# Patient Record
Sex: Female | Born: 2000 | Race: White | Hispanic: No | Marital: Single | Smoking: Never smoker
Health system: Southern US, Community
[De-identification: ages and names within clinical notes are randomized; demographics above are authoritative.]

## PROBLEM LIST (undated history)

## (undated) DIAGNOSIS — K219 Gastro-esophageal reflux disease without esophagitis: Secondary | ICD-10-CM

## (undated) HISTORY — PX: CHOLECYSTECTOMY: SHX55

## (undated) HISTORY — PX: TONSILLECTOMY: SUR1361

---
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
## 2021-02-15 ENCOUNTER — Other Ambulatory Visit: Payer: Self-pay

## 2021-02-15 ENCOUNTER — Emergency Department (HOSPITAL_COMMUNITY)
Admission: EM | Admit: 2021-02-15 | Discharge: 2021-02-16 | Disposition: A | Discharge status: Home / Self Care | Payer: Medicaid Other | Attending: Emergency Medicine | Admitting: Emergency Medicine

## 2021-02-15 DIAGNOSIS — R509 Fever, unspecified: Secondary | ICD-10-CM | POA: Insufficient documentation

## 2021-02-15 DIAGNOSIS — Z5321 Procedure and treatment not carried out due to patient leaving prior to being seen by health care provider: Secondary | ICD-10-CM | POA: Insufficient documentation

## 2021-02-15 DIAGNOSIS — R109 Unspecified abdominal pain: Secondary | ICD-10-CM | POA: Diagnosis not present

## 2021-02-15 DIAGNOSIS — Z79899 Other long term (current) drug therapy: Secondary | ICD-10-CM | POA: Insufficient documentation

## 2021-02-15 DIAGNOSIS — R112 Nausea with vomiting, unspecified: Secondary | ICD-10-CM | POA: Insufficient documentation

## 2021-02-15 DIAGNOSIS — K219 Gastro-esophageal reflux disease without esophagitis: Secondary | ICD-10-CM | POA: Diagnosis not present

## 2021-02-15 LAB — COMPREHENSIVE METABOLIC PANEL
ALT: 41 U/L (ref 0–44)
AST: 32 U/L (ref 15–41)
Albumin: 4.2 g/dL (ref 3.5–5.0)
Alkaline Phosphatase: 66 U/L (ref 38–126)
Anion gap: 13 (ref 5–15)
BUN: 8 mg/dL (ref 6–20)
CO2: 21 mmol/L — ABNORMAL LOW (ref 22–32)
Calcium: 9.6 mg/dL (ref 8.9–10.3)
Chloride: 105 mmol/L (ref 98–111)
Creatinine, Ser: 0.66 mg/dL (ref 0.44–1.00)
GFR, Estimated: >60 mL/min (ref >60)
Glucose, Bld: 103 mg/dL — ABNORMAL HIGH (ref 70–99)
Potassium: 3.7 mmol/L (ref 3.5–5.1)
Sodium: 139 mmol/L (ref 135–145)
Total Bilirubin: 1 mg/dL (ref 0.3–1.2)
Total Protein: 8.3 g/dL — ABNORMAL HIGH (ref 6.5–8.1)

## 2021-02-15 LAB — CBC
HCT: 42.3 % (ref 36.0–46.0)
Hemoglobin: 14.1 g/dL (ref 12.0–15.0)
MCH: 28 pg (ref 26.0–34.0)
MCHC: 33.3 g/dL (ref 30.0–36.0)
MCV: 83.9 fL (ref 80.0–100.0)
Platelets: 346 10*3/uL (ref 150–400)
RBC: 5.04 MIL/uL (ref 3.87–5.11)
RDW: 12.8 % (ref 11.5–15.5)
WBC: 7.5 10*3/uL (ref 4.0–10.5)
nRBC: 0 % (ref 0.0–0.2)

## 2021-02-15 LAB — LIPASE, BLOOD: Lipase: 25 U/L (ref 11–51)

## 2021-02-15 LAB — I-STAT BETA HCG BLOOD, ED (MC, WL, AP ONLY): I-stat hCG, quantitative: <5 m[IU]/mL (ref <5)

## 2021-02-15 NOTE — ED Triage Notes (Signed)
Pt here POV with c/o of abdominal pain and emesis X2 days.  Pt feels she may have a stomach ulcer. Pain 1010. Denies fever today

## 2021-02-16 ENCOUNTER — Emergency Department (HOSPITAL_COMMUNITY): Payer: Medicaid Other

## 2021-02-16 ENCOUNTER — Emergency Department (HOSPITAL_BASED_OUTPATIENT_CLINIC_OR_DEPARTMENT_OTHER)
Admission: EM | Admit: 2021-02-16 | Discharge: 2021-02-16 | Disposition: A | Discharge status: Home / Self Care | Payer: Medicaid Other | Source: Home / Self Care | Attending: Emergency Medicine | Admitting: Emergency Medicine

## 2021-02-16 ENCOUNTER — Other Ambulatory Visit: Payer: Self-pay

## 2021-02-16 ENCOUNTER — Encounter (HOSPITAL_BASED_OUTPATIENT_CLINIC_OR_DEPARTMENT_OTHER): Payer: Self-pay | Admitting: *Deleted

## 2021-02-16 DIAGNOSIS — K219 Gastro-esophageal reflux disease without esophagitis: Secondary | ICD-10-CM | POA: Insufficient documentation

## 2021-02-16 DIAGNOSIS — Z79899 Other long term (current) drug therapy: Secondary | ICD-10-CM | POA: Insufficient documentation

## 2021-02-16 DIAGNOSIS — R1010 Upper abdominal pain, unspecified: Secondary | ICD-10-CM | POA: Insufficient documentation

## 2021-02-16 DIAGNOSIS — R109 Unspecified abdominal pain: Secondary | ICD-10-CM

## 2021-02-16 DIAGNOSIS — E860 Dehydration: Secondary | ICD-10-CM

## 2021-02-16 DIAGNOSIS — R509 Fever, unspecified: Secondary | ICD-10-CM | POA: Insufficient documentation

## 2021-02-16 DIAGNOSIS — R112 Nausea with vomiting, unspecified: Secondary | ICD-10-CM | POA: Insufficient documentation

## 2021-02-16 HISTORY — DX: Gastro-esophageal reflux disease without esophagitis: K21.9

## 2021-02-16 LAB — RAPID URINE DRUG SCREEN, HOSP PERFORMED
Amphetamines: NOT DETECTED
Barbiturates: POSITIVE — AB
Benzodiazepines: NOT DETECTED
Cocaine: NOT DETECTED
Opiates: NOT DETECTED
Tetrahydrocannabinol: POSITIVE — AB

## 2021-02-16 LAB — URINALYSIS, ROUTINE W REFLEX MICROSCOPIC
Bacteria, UA: NONE SEEN
Bilirubin Urine: NEGATIVE
Glucose, UA: NEGATIVE mg/dL
Glucose, UA: NEGATIVE mg/dL
Hgb urine dipstick: NEGATIVE
Hgb urine dipstick: NEGATIVE
Ketones, ur: 80 mg/dL — AB
Ketones, ur: >80 mg/dL — AB
Leukocytes,Ua: NEGATIVE
Leukocytes,Ua: NEGATIVE
Nitrite: NEGATIVE
Nitrite: NEGATIVE
Protein, ur: 30 mg/dL — AB
Protein, ur: 100 mg/dL — AB
Specific Gravity, Urine: >1.046 — ABNORMAL HIGH (ref 1.005–1.030)
Specific Gravity, Urine: 1.046 — ABNORMAL HIGH (ref 1.005–1.030)
pH: 6 (ref 5.0–8.0)
pH: 6 (ref 5.0–8.0)

## 2021-02-16 LAB — CBC
HCT: 18.6 % — ABNORMAL LOW (ref 36.0–46.0)
HCT: 40 % (ref 36.0–46.0)
Hemoglobin: 6.3 g/dL — CL (ref 12.0–15.0)
Hemoglobin: 13.9 g/dL (ref 12.0–15.0)
MCH: 28.1 pg (ref 26.0–34.0)
MCH: 28.2 pg (ref 26.0–34.0)
MCHC: 33.9 g/dL (ref 30.0–36.0)
MCHC: 34.8 g/dL (ref 30.0–36.0)
MCV: 83 fL (ref 80.0–100.0)
MCV: 81.1 fL (ref 80.0–100.0)
Platelets: 173 10*3/uL (ref 150–400)
Platelets: 352 10*3/uL (ref 150–400)
RBC: 2.24 MIL/uL — ABNORMAL LOW (ref 3.87–5.11)
RBC: 4.93 MIL/uL (ref 3.87–5.11)
RDW: 12.9 % (ref 11.5–15.5)
RDW: 12.9 % (ref 11.5–15.5)
WBC: 4.3 10*3/uL (ref 4.0–10.5)
WBC: 9 10*3/uL (ref 4.0–10.5)
nRBC: 0 % (ref 0.0–0.2)
nRBC: 0 % (ref 0.0–0.2)

## 2021-02-16 LAB — PREGNANCY, URINE: Preg Test, Ur: NEGATIVE

## 2021-02-16 LAB — COMPREHENSIVE METABOLIC PANEL
ALT: 37 U/L (ref 0–44)
AST: 31 U/L (ref 15–41)
Albumin: 4.1 g/dL (ref 3.5–5.0)
Alkaline Phosphatase: 62 U/L (ref 38–126)
Anion gap: 15 (ref 5–15)
BUN: 10 mg/dL (ref 6–20)
CO2: 19 mmol/L — ABNORMAL LOW (ref 22–32)
Calcium: 9 mg/dL (ref 8.9–10.3)
Chloride: 104 mmol/L (ref 98–111)
Creatinine, Ser: 0.54 mg/dL (ref 0.44–1.00)
GFR, Estimated: >60 mL/min (ref >60)
Glucose, Bld: 88 mg/dL (ref 70–99)
Potassium: 3.5 mmol/L (ref 3.5–5.1)
Sodium: 138 mmol/L (ref 135–145)
Total Bilirubin: 1.3 mg/dL — ABNORMAL HIGH (ref 0.3–1.2)
Total Protein: 7.8 g/dL (ref 6.5–8.1)

## 2021-02-16 LAB — LIPASE, BLOOD: Lipase: 12 U/L (ref 11–51)

## 2021-02-16 MED ORDER — FAMOTIDINE IN NACL 20-0.9 MG/50ML-% IV SOLN
20.0000 mg | Freq: Once | INTRAVENOUS | Status: AC
  Administered 2021-02-16: 20 mg via INTRAVENOUS
  Filled 2021-02-16: qty 50

## 2021-02-16 MED ORDER — ONDANSETRON HCL 4 MG/2ML IJ SOLN
INTRAMUSCULAR | Status: AC
  Filled 2021-02-16: qty 2

## 2021-02-16 MED ORDER — SODIUM CHLORIDE 0.9 % IV BOLUS
1000.0000 mL | Freq: Once | INTRAVENOUS | Status: AC
  Administered 2021-02-16: 1000 mL via INTRAVENOUS

## 2021-02-16 MED ORDER — ONDANSETRON 4 MG PO TBDP
4.0000 mg | ORAL_TABLET | Freq: Once | ORAL | Status: AC
  Administered 2021-02-16: 4 mg via ORAL
  Filled 2021-02-16: qty 1

## 2021-02-16 MED ORDER — LACTATED RINGERS IV BOLUS
1000.0000 mL | Freq: Once | INTRAVENOUS | Status: AC
  Administered 2021-02-16: 1000 mL via INTRAVENOUS

## 2021-02-16 MED ORDER — MORPHINE SULFATE (PF) 4 MG/ML IV SOLN
4.0000 mg | Freq: Once | INTRAVENOUS | Status: AC
  Administered 2021-02-16: 4 mg via INTRAVENOUS
  Filled 2021-02-16: qty 1

## 2021-02-16 MED ORDER — ONDANSETRON 8 MG PO TBDP: 8.0000 mg | ORAL_TABLET | Freq: Three times a day (TID) | ORAL | 0 refills | Status: AC | PRN

## 2021-02-16 MED ORDER — ONDANSETRON HCL 4 MG/2ML IJ SOLN
4.0000 mg | Freq: Once | INTRAMUSCULAR | Status: AC
  Administered 2021-02-16: 4 mg via INTRAVENOUS
  Filled 2021-02-16: qty 2

## 2021-02-16 MED ORDER — ACETAMINOPHEN 325 MG PO TABS
650.0000 mg | ORAL_TABLET | Freq: Once | ORAL | Status: AC
  Administered 2021-02-16: 650 mg via ORAL
  Filled 2021-02-16: qty 2

## 2021-02-16 MED ORDER — ONDANSETRON HCL 4 MG/2ML IJ SOLN
4.0000 mg | Freq: Once | INTRAMUSCULAR | Status: AC
  Administered 2021-02-16: 4 mg via INTRAVENOUS

## 2021-02-16 MED ORDER — IOHEXOL 300 MG/ML  SOLN
100.0000 mL | Freq: Once | INTRAMUSCULAR | Status: AC | PRN
  Administered 2021-02-16: 100 mL via INTRAVENOUS

## 2021-02-16 NOTE — ED Triage Notes (Signed)
Pt arrives via [facility] for RUQ abdominal pain with N/V. Pt was seen at [facility] last night for same, left AMA.   EMS last VS - 146/82, HR 82, CBG 108, 98% on RA   # 18 L AC

## 2021-02-16 NOTE — ED Triage Notes (Signed)
Abdominal pain, nausea and vomiting and diarrhea.  Low grade temp of 99.5 3 days ago.

## 2021-02-16 NOTE — ED Notes (Signed)
Patient states her pain is coming back. Triage rn notified

## 2021-02-16 NOTE — ED Notes (Signed)
The patient stated that she would like her IV removed so that she could leave. The patient left without being seen. The patient was informed that she should return or seek medical help if her symptoms worsen.
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
## 2021-02-16 NOTE — Discharge Instructions (Addendum)
It was our pleasure to provide your ER care today - we hope that you feel better.  Drink plenty of fluids/stay well hydrated.   Follow up with primary care doctor in the next few days if symptoms fail to improve/resolve.  If gi symptoms, try pepcid and maalox for symptom relief. Take zofran as need, as prescribed,  for nausea.   Return to ER if worse, new symptoms, fevers, new, worsening or severe pain, persistent vomiting, or other concern.   Please note that increasingly we are seeing THC/marijuana as the cause of a recurrent abdominal pain and nausea/vomiting condition called Cannabinoid Hyperemesis Syndrome - in these cases, avoiding marijuana use will prevent symptoms from recurring - see attached information.   You were given pain medication in the ER - no driving for the next 8 hours.

## 2021-02-20 ENCOUNTER — Emergency Department (HOSPITAL_BASED_OUTPATIENT_CLINIC_OR_DEPARTMENT_OTHER)
Admission: EM | Admit: 2021-02-20 | Discharge: 2021-02-20 | Disposition: A | Discharge status: Home / Self Care | Payer: Medicaid Other | Attending: Emergency Medicine | Admitting: Emergency Medicine

## 2021-02-20 ENCOUNTER — Encounter (HOSPITAL_BASED_OUTPATIENT_CLINIC_OR_DEPARTMENT_OTHER): Payer: Self-pay

## 2021-02-20 DIAGNOSIS — K219 Gastro-esophageal reflux disease without esophagitis: Secondary | ICD-10-CM | POA: Diagnosis not present

## 2021-02-20 DIAGNOSIS — R1013 Epigastric pain: Secondary | ICD-10-CM | POA: Diagnosis present

## 2021-02-20 DIAGNOSIS — R112 Nausea with vomiting, unspecified: Secondary | ICD-10-CM | POA: Diagnosis not present

## 2021-02-20 LAB — PREGNANCY, URINE: Preg Test, Ur: NEGATIVE

## 2021-02-20 LAB — URINALYSIS, ROUTINE W REFLEX MICROSCOPIC
Glucose, UA: NEGATIVE mg/dL
Ketones, ur: >80 mg/dL — AB
Leukocytes,Ua: NEGATIVE
Nitrite: NEGATIVE
Protein, ur: 30 mg/dL — AB
Specific Gravity, Urine: 1.037 — ABNORMAL HIGH (ref 1.005–1.030)
pH: 6 (ref 5.0–8.0)

## 2021-02-20 MED ORDER — ONDANSETRON HCL 4 MG/2ML IJ SOLN
4.0000 mg | Freq: Once | INTRAMUSCULAR | Status: AC
  Administered 2021-02-20: 4 mg via INTRAVENOUS
  Filled 2021-02-20: qty 2

## 2021-02-20 MED ORDER — LIDOCAINE VISCOUS HCL 2 % MT SOLN
15.0000 mL | Freq: Once | OROMUCOSAL | Status: AC
  Administered 2021-02-20: 15 mL via ORAL
  Filled 2021-02-20: qty 15

## 2021-02-20 MED ORDER — SODIUM CHLORIDE 0.9 % IV BOLUS
1000.0000 mL | Freq: Once | INTRAVENOUS | Status: AC
  Administered 2021-02-20: 1000 mL via INTRAVENOUS

## 2021-02-20 MED ORDER — ALUM & MAG HYDROXIDE-SIMETH 200-200-20 MG/5ML PO SUSP
30.0000 mL | Freq: Once | ORAL | Status: AC
  Administered 2021-02-20: 30 mL via ORAL
  Filled 2021-02-20: qty 30

## 2021-02-20 MED ORDER — DICYCLOMINE HCL 10 MG PO CAPS
10.0000 mg | ORAL_CAPSULE | Freq: Once | ORAL | Status: AC
  Administered 2021-02-20: 10 mg via ORAL
  Filled 2021-02-20: qty 1

## 2021-02-20 MED ORDER — OMEPRAZOLE 20 MG PO CPDR: 20.0000 mg | DELAYED_RELEASE_CAPSULE | Freq: Two times a day (BID) | ORAL | 0 refills | Status: AC

## 2021-02-20 MED ORDER — SUCRALFATE 1 G PO TABS: 1.0000 g | ORAL_TABLET | Freq: Three times a day (TID) | ORAL | 0 refills | Status: AC

## 2021-02-20 NOTE — ED Notes (Signed)
Pt verbalizes understanding of discharge instructions. Opportunity for questioning and answers were provided. Armand removed by staff, pt discharged from ED to home. Educated to pick up Rx and f/u with GI.

## 2021-02-20 NOTE — ED Triage Notes (Addendum)
Pt was BIBA for ongoing epigastric pain and N/V for the last three to four days, although sx have been off and on since 4th of July weekend. Last episode of emesis was appx last night at 2200. Last use of marijuana was three days ago. Took zofran with minimal relief of nausea.
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
## 2021-02-20 NOTE — Discharge Instructions (Addendum)
You were seen today for abdominal pain.  I suspect you may have reflux or an ulcer.  Start omeprazole daily.  Use Carafate as needed.  Continue Zofran.  It is very importantly follow-up with gastroenterology.

## 2021-02-21 ENCOUNTER — Ambulatory Visit: Payer: Self-pay

## 2021-02-21 NOTE — Telephone Encounter (Signed)
Pt. Reports she was seen in ED yesterday and saw PCP today for abdominal pain and vomiting. States Zofran "helps some but I'm still not keeping down solid food and having a lot of abdominal pain." Pain 10/10. Instructed to go to UC/ED. Verbalizes understanding.    Reason for Disposition  Patient sounds very sick or weak to the triager  Answer Assessment - Initial Assessment Questions 1. VOMITING SEVERITY: "How many times have you vomited in the past 24 hours?"     - MILD:  1 - 2 times/day    - MODERATE: 3 - 5 times/day, decreased oral intake without significant weight loss or symptoms of dehydration    - SEVERE: 6 or more times/day, vomits everything or nearly everything, with significant weight loss, symptoms of dehydration      4 2. ONSET: "When did the vomiting begin?"      July 2 3. FLUIDS: "What fluids or food have you vomited up today?" "Have you been able to keep any fluids down?"     Yes 4. ABDOMINAL PAIN: "Are your having any abdominal pain?" If yes : "How bad is it and what does it feel like?" (e.g., crampy, dull, intermittent, constant)      Yes   10/10 5. DIARRHEA: "Is there any diarrhea?" If Yes, ask: "How many times today?"      No 6. CONTACTS: "Is there anyone else in the family with the same symptoms?"      No 7. CAUSE: "What do you think is causing your vomiting?"     Unsure 8. HYDRATION STATUS: "Any signs of dehydration?" (e.g., dry mouth [not only dry lips], too weak to stand) "When did you last urinate?"     No 9. OTHER SYMPTOMS: "Do you have any other symptoms?" (e.g., fever, headache, vertigo, vomiting blood or coffee grounds, recent head injury)     No 10. PREGNANCY: "Is there any chance you are pregnant?" "When was your last menstrual period?"       No  Protocols used: Vomiting-A-AH

## 2021-02-22 ENCOUNTER — Emergency Department (HOSPITAL_BASED_OUTPATIENT_CLINIC_OR_DEPARTMENT_OTHER)
Admission: EM | Admit: 2021-02-22 | Discharge: 2021-02-22 | Disposition: A | Discharge status: Home / Self Care | Payer: Medicaid Other | Attending: Emergency Medicine | Admitting: Emergency Medicine

## 2021-02-22 ENCOUNTER — Other Ambulatory Visit: Payer: Self-pay

## 2021-02-22 ENCOUNTER — Encounter (HOSPITAL_BASED_OUTPATIENT_CLINIC_OR_DEPARTMENT_OTHER): Payer: Self-pay

## 2021-02-22 DIAGNOSIS — R1084 Generalized abdominal pain: Secondary | ICD-10-CM | POA: Diagnosis not present

## 2021-02-22 DIAGNOSIS — R112 Nausea with vomiting, unspecified: Secondary | ICD-10-CM | POA: Insufficient documentation

## 2021-02-22 DIAGNOSIS — R824 Acetonuria: Secondary | ICD-10-CM | POA: Diagnosis not present

## 2021-02-22 LAB — COMPREHENSIVE METABOLIC PANEL
ALT: 40 U/L (ref 0–44)
AST: 26 U/L (ref 15–41)
Albumin: 4.3 g/dL (ref 3.5–5.0)
Alkaline Phosphatase: 63 U/L (ref 38–126)
Anion gap: 18 — ABNORMAL HIGH (ref 5–15)
BUN: 9 mg/dL (ref 6–20)
CO2: 16 mmol/L — ABNORMAL LOW (ref 22–32)
Calcium: 9.1 mg/dL (ref 8.9–10.3)
Chloride: 103 mmol/L (ref 98–111)
Creatinine, Ser: 0.57 mg/dL (ref 0.44–1.00)
GFR, Estimated: >60 mL/min (ref >60)
Glucose, Bld: 74 mg/dL (ref 70–99)
Potassium: 3.3 mmol/L — ABNORMAL LOW (ref 3.5–5.1)
Sodium: 137 mmol/L (ref 135–145)
Total Bilirubin: 1 mg/dL (ref 0.3–1.2)
Total Protein: 8.1 g/dL (ref 6.5–8.1)

## 2021-02-22 LAB — LIPASE, BLOOD: Lipase: 42 U/L (ref 11–51)

## 2021-02-22 LAB — URINALYSIS, ROUTINE W REFLEX MICROSCOPIC
Bilirubin Urine: NEGATIVE
Glucose, UA: NEGATIVE mg/dL
Hgb urine dipstick: NEGATIVE
Ketones, ur: >80 mg/dL — AB
Leukocytes,Ua: NEGATIVE
Nitrite: NEGATIVE
Protein, ur: 30 mg/dL — AB
Specific Gravity, Urine: 1.027 (ref 1.005–1.030)
pH: 5.5 (ref 5.0–8.0)

## 2021-02-22 LAB — CBC
HCT: 38.4 % (ref 36.0–46.0)
Hemoglobin: 13.3 g/dL (ref 12.0–15.0)
MCH: 28.2 pg (ref 26.0–34.0)
MCHC: 34.6 g/dL (ref 30.0–36.0)
MCV: 81.4 fL (ref 80.0–100.0)
Platelets: 329 10*3/uL (ref 150–400)
RBC: 4.72 MIL/uL (ref 3.87–5.11)
RDW: 12.7 % (ref 11.5–15.5)
WBC: 11 10*3/uL — ABNORMAL HIGH (ref 4.0–10.5)
nRBC: 0 % (ref 0.0–0.2)

## 2021-02-22 LAB — PREGNANCY, URINE: Preg Test, Ur: NEGATIVE

## 2021-02-22 MED ORDER — LACTATED RINGERS IV BOLUS
1000.0000 mL | Freq: Once | INTRAVENOUS | Status: AC
  Administered 2021-02-22: 1000 mL via INTRAVENOUS

## 2021-02-22 MED ORDER — HALOPERIDOL LACTATE 5 MG/ML IJ SOLN
5.0000 mg | Freq: Once | INTRAMUSCULAR | Status: AC
  Administered 2021-02-22: 5 mg via INTRAVENOUS
  Filled 2021-02-22: qty 1

## 2021-02-22 NOTE — ED Provider Notes (Signed)
[facility] EMERGENCY DEPARTMENT Provider Note  CSN: [id number] Arrival date & time: 02/22/21 0803    History Chief Complaint  Patient presents with   Abdominal Pain    Catherine Conley is a 20 y.o. female presents for re-evaluation of persistent abdominal pain and vomiting ongoing for about a month. She has been seen in the ED twice in the last week for same. Had negative workup each time and was improved prior to discharge. She saw PCP yesterday and was referred to GI. She has been taking PPI, antiemetics, carafate without improvement. She was cautioned at one previous visit that her marijuana use may be contributing and so she stopped smoking 5-6 days ago.    Past Medical History:  Diagnosis Date   Abdominal pain    Constipation    GERD (gastroesophageal reflux disease)     Past Surgical History:  Procedure Laterality Date   TONSILLECTOMY      Family History  Problem Relation Age of Onset   Ulcers Father    Psychiatric Illness Father        Bipolar   Bipolar disorder Father    COPD Father    Rheum arthritis Father    Depression Brother    Anxiety disorder Brother    Hypertension Mother    Psychiatric Illness Mother        Bipolar   Bipolar disorder Mother    Alcohol abuse Mother    Cholelithiasis Paternal Grandmother    Cancer Paternal Grandmother        breast CA    Social History   Tobacco Use   Smoking status: Never   Smokeless tobacco: Never  Vaping Use   Vaping Use: Never used  Substance Use Topics   Alcohol use: No   Drug use: Yes    Types: Marijuana    Comment: 3 days ago     Home Medications Prior to Admission medications   Medication Sig Start Date End Date Taking? Authorizing Provider  omeprazole (PRILOSEC) 20 MG capsule Take 1 capsule (20 mg total) by mouth 2 (two) times daily before a meal. 02/20/21  Yes Horton, Mayer Masker, MD  ondansetron (ZOFRAN ODT) 8 MG disintegrating tablet Take 1 tablet (8 mg total) by mouth every 8  (eight) hours as needed for nausea or vomiting. 02/16/21  Yes Cathren Laine, MD  sucralfate (CARAFATE) 1 g tablet Take 1 tablet (1 g total) by mouth 4 (four) times daily -  with meals and at bedtime. 02/20/21  Yes Horton, Mayer Masker, MD  etonogestrel (NEXPLANON) 68 MG IMPL implant Inject into the skin.    [provider]     Allergies    Patient has no known allergies.   Review of Systems   Review of Systems A comprehensive review of systems was completed and negative except as noted in HPI.    Physical Exam BP (!) 124/102   Pulse 83   Temp 98.2 F (36.8 C) (Oral)   Resp 18   Ht 5\' 7"  (1.702 m)   Wt 93 kg   SpO2 100%   BMI 32.11 kg/m   Physical Exam Vitals and nursing note reviewed.  Constitutional:      Appearance: Normal appearance.  HENT:     Head: Normocephalic and atraumatic.     Nose: Nose normal.     Mouth/Throat:     Mouth: Mucous membranes are moist.  Eyes:     Extraocular Movements: Extraocular movements intact.     Conjunctiva/sclera: Conjunctivae  normal.  Cardiovascular:     Rate and Rhythm: Normal rate.  Pulmonary:     Effort: Pulmonary effort is normal.     Breath sounds: Normal breath sounds.  Abdominal:     General: Abdomen is flat.     Palpations: Abdomen is soft.     Tenderness: There is generalized abdominal tenderness. There is no guarding. Negative signs include Murphy's sign and McBurney's sign.  Musculoskeletal:        General: No swelling. Normal range of motion.     Cervical back: Neck supple.  Skin:    General: Skin is warm and dry.  Neurological:     General: No focal deficit present.     Mental Status: She is alert.  Psychiatric:        Mood and Affect: Mood normal.     ED Results / Procedures / Treatments   Labs (all labs ordered are listed, but only abnormal results are displayed) Labs Reviewed  COMPREHENSIVE METABOLIC PANEL - Abnormal; Notable for the following components:      Result Value   Potassium 3.3 (*)     CO2 16 (*)    Anion gap 18 (*)    All other components within normal limits  CBC - Abnormal; Notable for the following components:   WBC 11.0 (*)    All other components within normal limits  URINALYSIS, ROUTINE W REFLEX MICROSCOPIC - Abnormal; Notable for the following components:   Ketones, ur >80 (*)    Protein, ur 30 (*)    Bacteria, UA RARE (*)    All other components within normal limits  LIPASE, BLOOD  PREGNANCY, URINE    EKG None   Radiology No results found.  Procedures Procedures  Medications Ordered in the ED Medications  haloperidol lactate (HALDOL) injection 5 mg (5 mg Intravenous Given 02/22/21 0922)  lactated ringers bolus 1,000 mL (0 mLs Intravenous Stopped 02/22/21 1027)  lactated ringers bolus 1,000 mL (0 mLs Intravenous Stopped 02/22/21 1306)     MDM Rules/Calculators/A&P MDM Patient with continued abdominal pain and reported vomiting at home. None seen in the ED. Will give IVF, recheck labs and Haldol for antiemetic.   ED Course  I have reviewed the triage vital signs and the nursing notes.  Pertinent labs & imaging results that were available during my care of the patient were reviewed by me and considered in my medical decision making (see chart for details).  Clinical Course as of 02/22/21 1452  Wed Feb 22, 2021  0930 CBC unremarkable. CMP with mild hypokalemia and mild anion gap, likely from starvation ketosis based on ketones in UA from previous visit. Awaiting UA today.  [CS]  1120 UA again shows marked ketonuria. Patient states she is feeling better but still reluctant to try to eat. Will give a second bolus of LR to help clear ketones and reassess.  [CS]  1334 Patient feeling 100% better. Has tolerated gingerale and a snack. Advised to keep drinking plenty of fluids, preferably with electrolytes and advance her diet as tolerated. She has had a GI referral from her PCP ([facility]) but not scheduled yet. Will also give referral to [facility] if she  has trouble getting an appointment.  [CS]    Clinical Course User Index [CS] Pollyann Savoy, MD    Final Clinical Impression(s) / ED Diagnoses Final diagnoses:  Non-intractable vomiting with nausea, unspecified vomiting type  Ketonuria    Rx / DC Orders ED Discharge Orders  None        Pollyann Savoy, MD 02/22/21 1452

## 2021-02-22 NOTE — ED Notes (Signed)
Pt arrived by EMS from home.  Pt reports recently diagnosed with peptic ulcer.  Has a lot of pain after eating and has been vomiting.  Last vomiting episode was just before EMS picking her up.  Vitals 144/98, HR 84, Resp 16, 99% RA, CBG 71. 

## 2021-02-22 NOTE — ED Triage Notes (Signed)
Pt arrived by EMS from home.  Pt reports recently diagnosed with peptic ulcer.  Has a lot of pain after eating and has been vomiting.  Last vomiting episode was just before EMS picking her up.  Vitals 144/98, HR 84, Resp 16, 99% RA, CBG 71.

## 2021-02-22 NOTE — ED Notes (Signed)
Patient is resting comfortably. 

## 2021-02-23 ENCOUNTER — Emergency Department (HOSPITAL_BASED_OUTPATIENT_CLINIC_OR_DEPARTMENT_OTHER): Payer: Medicaid Other

## 2021-02-23 ENCOUNTER — Other Ambulatory Visit: Payer: Self-pay

## 2021-02-23 ENCOUNTER — Encounter (HOSPITAL_BASED_OUTPATIENT_CLINIC_OR_DEPARTMENT_OTHER): Payer: Self-pay | Admitting: Emergency Medicine

## 2021-02-23 ENCOUNTER — Emergency Department (HOSPITAL_BASED_OUTPATIENT_CLINIC_OR_DEPARTMENT_OTHER)
Admission: EM | Admit: 2021-02-23 | Discharge: 2021-02-24 | Disposition: A | Discharge status: Home / Self Care | Payer: Medicaid Other | Attending: Emergency Medicine | Admitting: Emergency Medicine

## 2021-02-23 DIAGNOSIS — R1110 Vomiting, unspecified: Secondary | ICD-10-CM | POA: Diagnosis present

## 2021-02-23 DIAGNOSIS — G2402 Drug induced acute dystonia: Secondary | ICD-10-CM

## 2021-02-23 DIAGNOSIS — U071 COVID-19: Secondary | ICD-10-CM

## 2021-02-23 LAB — MAGNESIUM: Magnesium: 1.8 mg/dL (ref 1.7–2.4)

## 2021-02-23 LAB — COMPREHENSIVE METABOLIC PANEL
ALT: 50 U/L — ABNORMAL HIGH (ref 0–44)
AST: 33 U/L (ref 15–41)
Albumin: 4.6 g/dL (ref 3.5–5.0)
Alkaline Phosphatase: 69 U/L (ref 38–126)
Anion gap: 20 — ABNORMAL HIGH (ref 5–15)
BUN: 5 mg/dL — ABNORMAL LOW (ref 6–20)
CO2: 15 mmol/L — ABNORMAL LOW (ref 22–32)
Calcium: 9.6 mg/dL (ref 8.9–10.3)
Chloride: 101 mmol/L (ref 98–111)
Creatinine, Ser: 0.57 mg/dL (ref 0.44–1.00)
GFR, Estimated: >60 mL/min (ref >60)
Glucose, Bld: 114 mg/dL — ABNORMAL HIGH (ref 70–99)
Potassium: 3.3 mmol/L — ABNORMAL LOW (ref 3.5–5.1)
Sodium: 136 mmol/L (ref 135–145)
Total Bilirubin: 0.7 mg/dL (ref 0.3–1.2)
Total Protein: 8.8 g/dL — ABNORMAL HIGH (ref 6.5–8.1)

## 2021-02-23 LAB — CBC WITH DIFFERENTIAL/PLATELET
Abs Immature Granulocytes: 0.11 10*3/uL — ABNORMAL HIGH (ref 0.00–0.07)
Basophils Absolute: 0.1 10*3/uL (ref 0.0–0.1)
Basophils Relative: 1 %
Eosinophils Absolute: 0.1 10*3/uL (ref 0.0–0.5)
Eosinophils Relative: 1 %
HCT: 42.8 % (ref 36.0–46.0)
Hemoglobin: 14.8 g/dL (ref 12.0–15.0)
Immature Granulocytes: 1 %
Lymphocytes Relative: 22 %
Lymphs Abs: 3.5 10*3/uL (ref 0.7–4.0)
MCH: 28.1 pg (ref 26.0–34.0)
MCHC: 34.6 g/dL (ref 30.0–36.0)
MCV: 81.2 fL (ref 80.0–100.0)
Monocytes Absolute: 1.1 10*3/uL — ABNORMAL HIGH (ref 0.1–1.0)
Monocytes Relative: 7 %
Neutro Abs: 11.1 10*3/uL — ABNORMAL HIGH (ref 1.7–7.7)
Neutrophils Relative %: 68 %
Platelets: 486 10*3/uL — ABNORMAL HIGH (ref 150–400)
RBC: 5.27 MIL/uL — ABNORMAL HIGH (ref 3.87–5.11)
RDW: 13 % (ref 11.5–15.5)
WBC: 16.1 10*3/uL — ABNORMAL HIGH (ref 4.0–10.5)
nRBC: 0 % (ref 0.0–0.2)

## 2021-02-23 LAB — ETHANOL: Alcohol, Ethyl (B): <10 mg/dL (ref <10)

## 2021-02-23 LAB — LACTIC ACID, PLASMA
Lactic Acid, Venous: 4 mmol/L (ref 0.5–1.9)
Lactic Acid, Venous: 0.8 mmol/L (ref 0.5–1.9)

## 2021-02-23 MED ORDER — HYDROMORPHONE HCL 1 MG/ML IJ SOLN
1.0000 mg | Freq: Once | INTRAMUSCULAR | Status: AC
Start: 2021-02-23 — End: 2021-02-23
  Administered 2021-02-23: 1 mg via INTRAVENOUS
  Filled 2021-02-23: qty 1

## 2021-02-23 MED ORDER — SODIUM CHLORIDE 0.9 % IV SOLN: INTRAVENOUS | Status: DC

## 2021-02-23 MED ORDER — DIPHENHYDRAMINE HCL 50 MG/ML IJ SOLN
25.0000 mg | Freq: Once | INTRAMUSCULAR | Status: AC
  Administered 2021-02-23: 25 mg via INTRAVENOUS
  Filled 2021-02-23: qty 1

## 2021-02-23 MED ORDER — SODIUM CHLORIDE 0.9 % IV BOLUS
1000.0000 mL | Freq: Once | INTRAVENOUS | Status: AC
  Administered 2021-02-23: 1000 mL via INTRAVENOUS

## 2021-02-23 MED ORDER — LORAZEPAM 2 MG/ML IJ SOLN
1.0000 mg | Freq: Once | INTRAMUSCULAR | Status: AC
  Administered 2021-02-23: 1 mg via INTRAVENOUS
  Filled 2021-02-23: qty 1

## 2021-02-23 MED ORDER — LORAZEPAM 2 MG/ML IJ SOLN: 2.0000 mg | Freq: Once | INTRAMUSCULAR | Status: DC
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
## 2021-02-23 NOTE — ED Triage Notes (Signed)
  Patient BIB EMS for seizure like activity.  Patient states around 1930 she was on her phone and her eyes went crossed.  Patient started "feeling pulled to the right" and called EMS.  Patient head turned to the right and patient states she can not straighten it.  Patient states her hands and feet feel tight.  No LOC.  Patient is A&O x4 and answers questions appropriately.  No sick contacts, was not feeling unwell earlier today.  States no drugs or alcohol use.  HR 163, RR 44.

## 2021-02-23 NOTE — ED Notes (Signed)
  Patient vital signs at baseline.  Patient able to straighten neck and unclench fists.  HR 102, RR 22.  Dr Deretha Emory notified.

## 2021-02-24 LAB — RESP PANEL BY RT-PCR (FLU A&B, COVID) ARPGX2
Influenza A by PCR: NEGATIVE
Influenza B by PCR: NEGATIVE
SARS Coronavirus 2 by RT PCR: POSITIVE — AB

## 2021-02-24 LAB — TSH: TSH: 1.56 u[IU]/mL (ref 0.350–4.500)

## 2021-02-24 NOTE — ED Provider Notes (Signed)
Received a call from on-call telemetry neurologist.  He does not feel that the patient's symptoms tonight are consistent with seizure.  He agrees that it is likely dystonia from the Haldol administration for her gastroparesis/hyperemesis condition.  Does not require any further treatment at this time.   Gilda Crease, MD 02/24/21 [phone]

## 2021-02-24 NOTE — Consult Note (Signed)
   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 The fact the semiology lasted hours made it seems less likely to be a seizure with no known risk factors. With right neck locked to the right patient was conscious throughout the whole episode. Given the multiple doses of haldol agree with primary teams in terms of supportive care in regards to drug induced dystonia.  CT HEAD: Showed No Acute Hemorrhage or Acute Core Infarct  Our recommendations are outlined below.  Disposition: Outpatient Neurology follow up in 3-6 weeks   Metrics: [facility] Notification Time: 02/24/2021 01:12:59 Stamp Time: 02/24/2021 01:12:59 Callback Response Time: 02/24/2021 01:16:35   ----------------------------------------------------------------------------------------------------  Chief Complaint: Seizures  History of Present Illness: Patient is a 20 year old Female.  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 pt arrived 3 hours ago with possible seizure like activity, hands clenched, jaw locked and gaze to the right. Unsure if it was seizure or dystonic reaction.  No risk factors for seizures, no family, no trauma, no cns infections, no febrile seizure. Pt is at baseline, resting now. symptoms started at 7pm last night. Per patient symptoms lasted about 2 hours until she came to the ER> Tested positive for COVID tonight.          Examination: BP(128/77), Pulse(66), Blood Glucose(114)  Neuro Exam: General: Alert,Awake, Oriented to Time, Place, Person  Speech: Fluent:  Language: Intact:  Face: Symmetric:  Facial Sensation: Intact:  Visual Fields: Intact:  Extraocular  Movements: Intact:  Motor Exam: No Drift:  Sensation: Intact:  Coordination: Intact:     Patient / Family was informed the Neurology Consult would occur via TeleHealth consult by way of interactive audio and video telecommunications and consented to receiving care in this manner.  Patient is being evaluated for possible acute neurologic impairment and high probability of imminent or life - threatening deterioration.I spent total of 25 minutes providing care to this patient, including time for face to face visit via telemedicine, review of medical records, imaging studies and discussion of findings with providers, the patient and / or family.   Dr Letta Kocher Godric Lavell   [facility] [phone]  Case [id number]

## 2022-04-24 IMAGING — CT CT ABD-PELV W/ CM
2 of 4 series · 17 of 46 positions shown, 19 images · IV contrast (APPLIED)
Comparison: None.

CLINICAL DATA: Acute abdominal pain

EXAM:
CT ABDOMEN AND PELVIS WITH CONTRAST
TECHNIQUE: Multidetector CT imaging of the abdomen and pelvis was performed
using the standard protocol following bolus administration of
intravenous contrast.
CONTRAST:  100mL OMNIPAQUE IOHEXOL 300 MG/ML  SOLN

[Series 3: abdomen 5.0 · axial · 0.85mm/px · z∈[+929,+1359]mm · 14 of 98 slices shown, 16 images]
[im 6/98  soft-tissue]
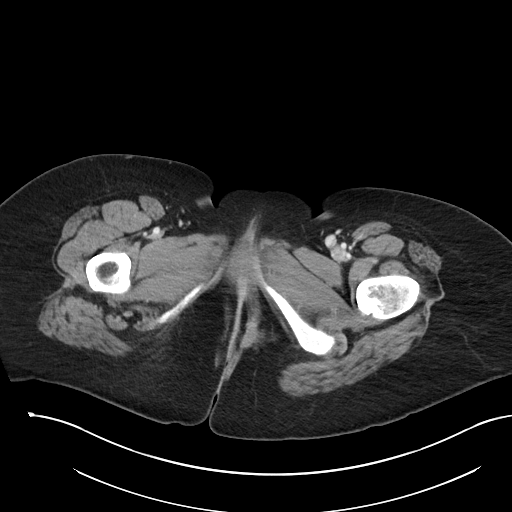
[im 6/98  bone]
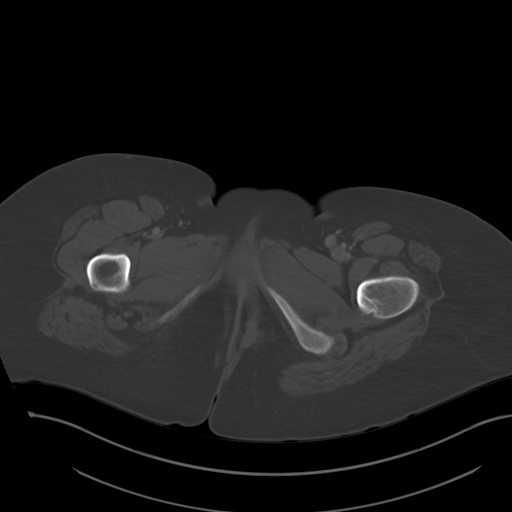
[im 11/98  soft-tissue]
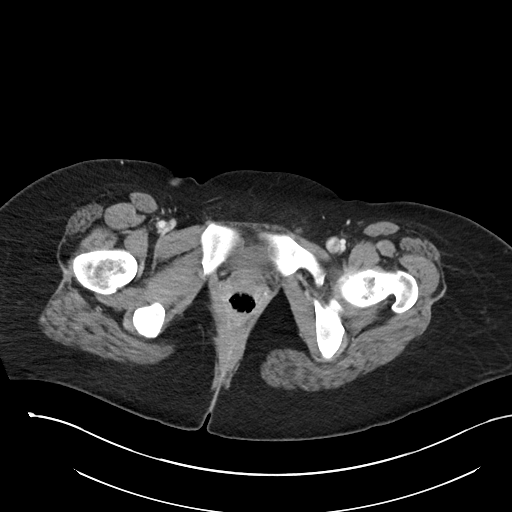
[im 22/98  soft-tissue]
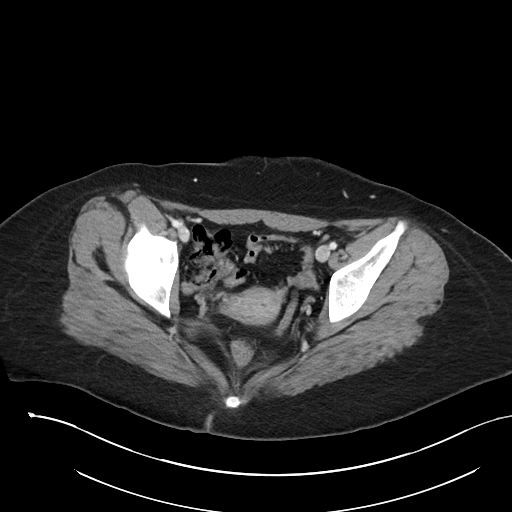
[im 27/98  soft-tissue]
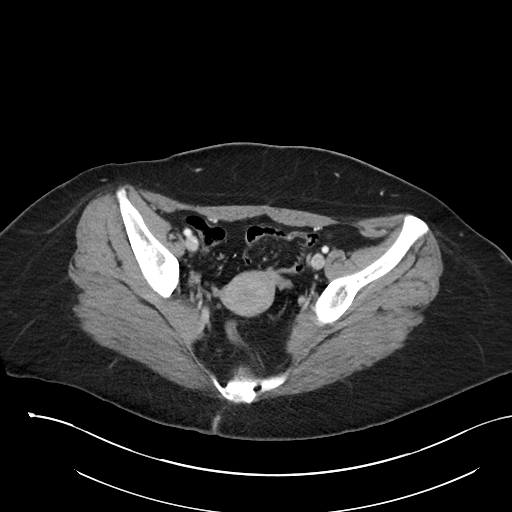
[im 33/98  soft-tissue]
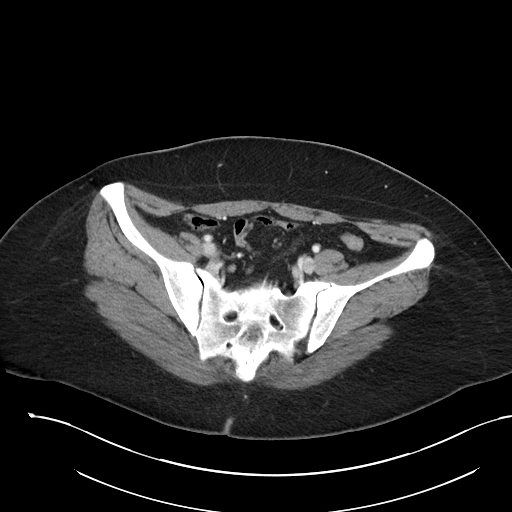
[im 38/98  soft-tissue]
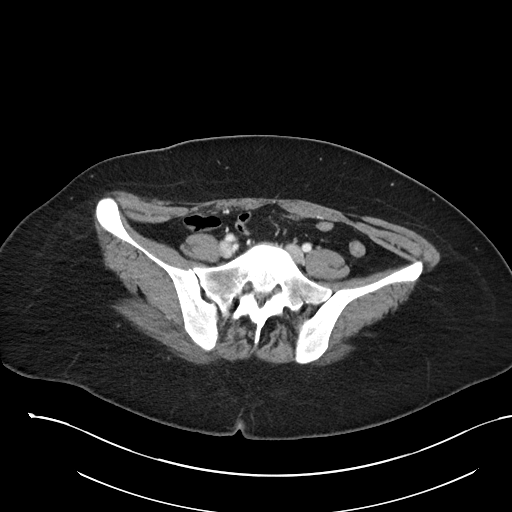
[im 44/98  soft-tissue]
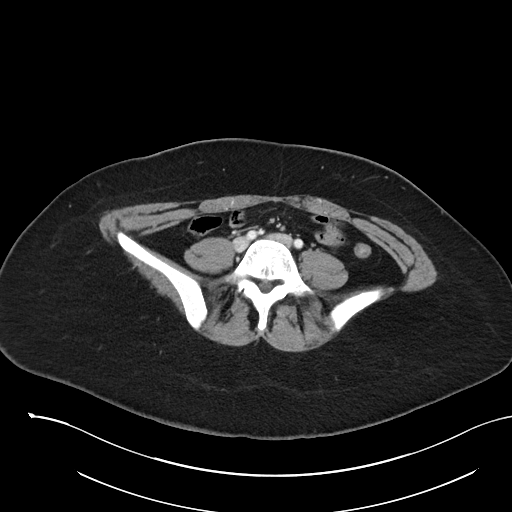
[im 54/98  soft-tissue]
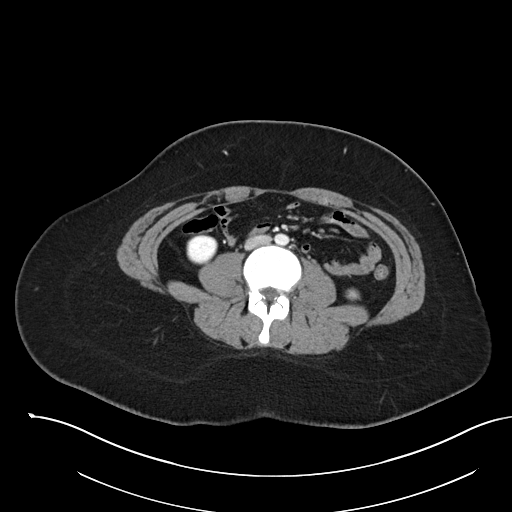
[im 60/98  soft-tissue]
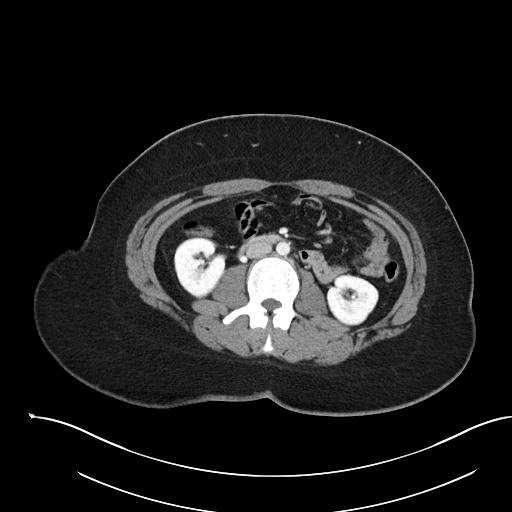
[im 60/98  bone]
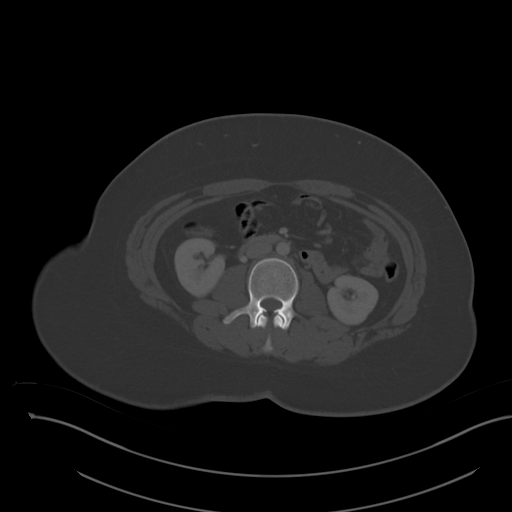
[im 65/98  soft-tissue]
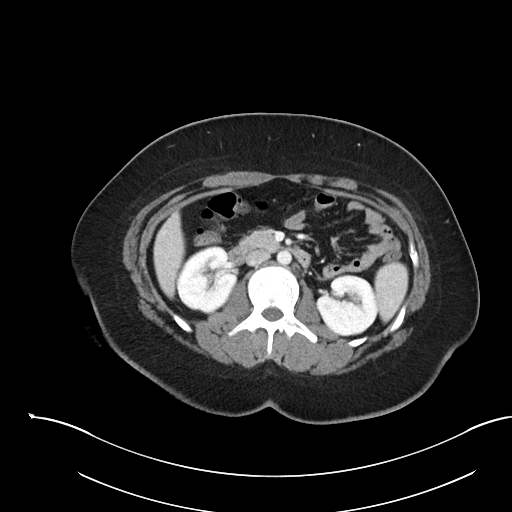
[im 71/98  soft-tissue]
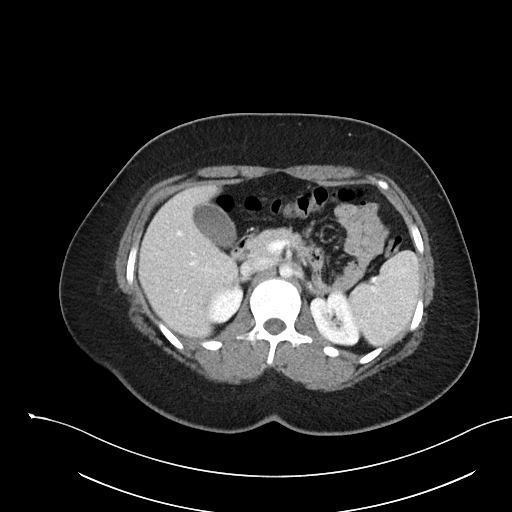
[im 76/98  soft-tissue]
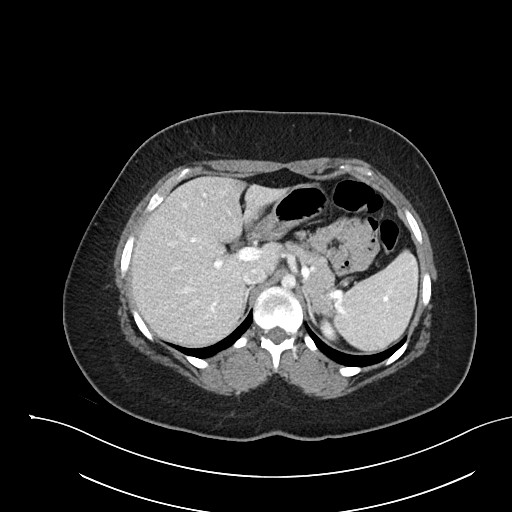
[im 87/98  soft-tissue]
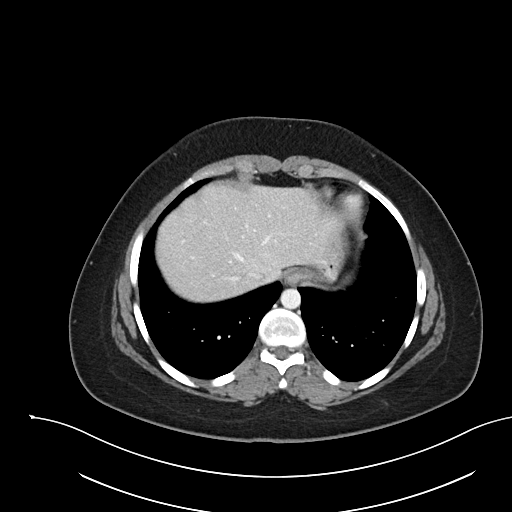
[im 92/98  soft-tissue]
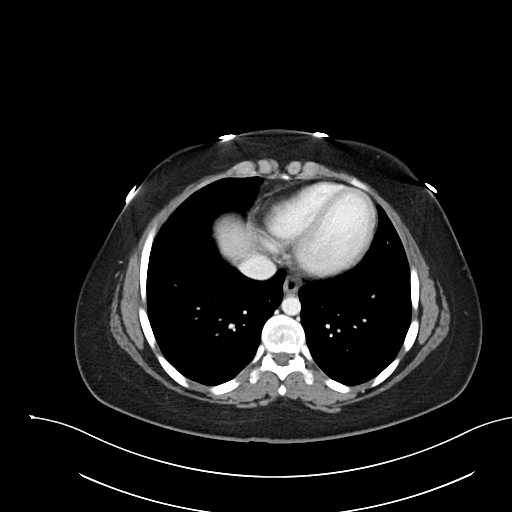

[Series 5: abdomen 3.0 mpr cor · coronal · 0.97mm/px · 3 of 97 slices shown]
[im 33/97  soft-tissue]
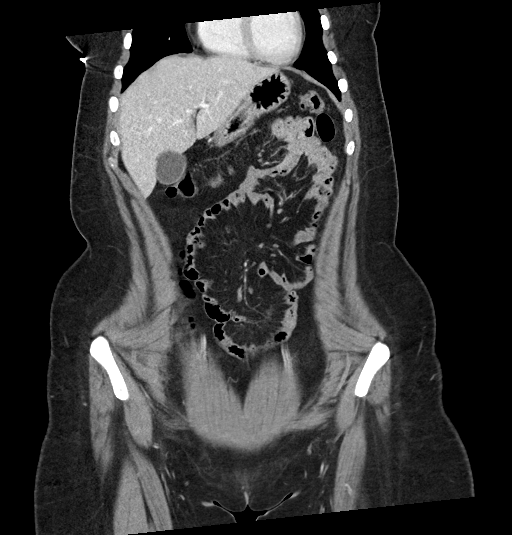
[im 43/97  soft-tissue]
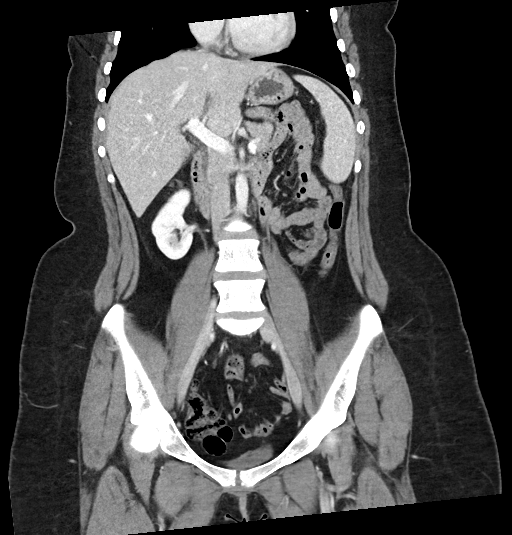
[im 54/97  soft-tissue]
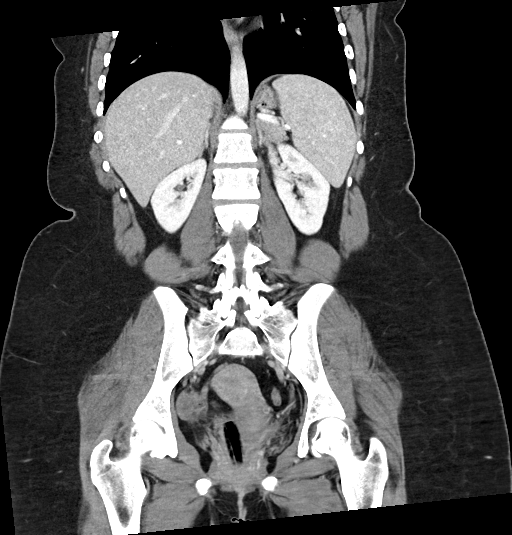

[17 of 46 positions shown; findings below may reference images not displayed]

FINDINGS: LOWER CHEST: Normal.

HEPATOBILIARY: Normal hepatic contours. No intra- or extrahepatic
biliary dilatation. The gallbladder is normal.

PANCREAS: Normal pancreas. No ductal dilatation or peripancreatic
fluid collection.

SPLEEN: Normal.

ADRENALS/URINARY TRACT: The adrenal glands are normal. No
hydronephrosis, nephroureterolithiasis or solid renal mass. The
urinary bladder is normal for degree of distention

STOMACH/BOWEL: There is no hiatal hernia. Normal duodenal course and
caliber. No small bowel dilatation or inflammation. No focal colonic
abnormality. Normal appendix.

VASCULAR/LYMPHATIC: Normal course and caliber of the major abdominal
vessels. No abdominal or pelvic lymphadenopathy.

REPRODUCTIVE: Normal uterus. No adnexal mass.

MUSCULOSKELETAL. No bony spinal canal stenosis or focal osseous
abnormality.

OTHER: None.
IMPRESSION: No acute abnormality of the abdomen or pelvis.

## 2022-05-01 IMAGING — DX DG CHEST 1V PORT
1 series · 2 of 2 positions shown · non-contrast
Comparison: Radiograph 12/18/2010

CLINICAL DATA: Anxiety, vague neurologic symptoms.

EXAM:
PORTABLE CHEST 1 VIEW

[Series 1: chest · 0.14mm/px · 2 of 2 slices shown]
[im 1/2]
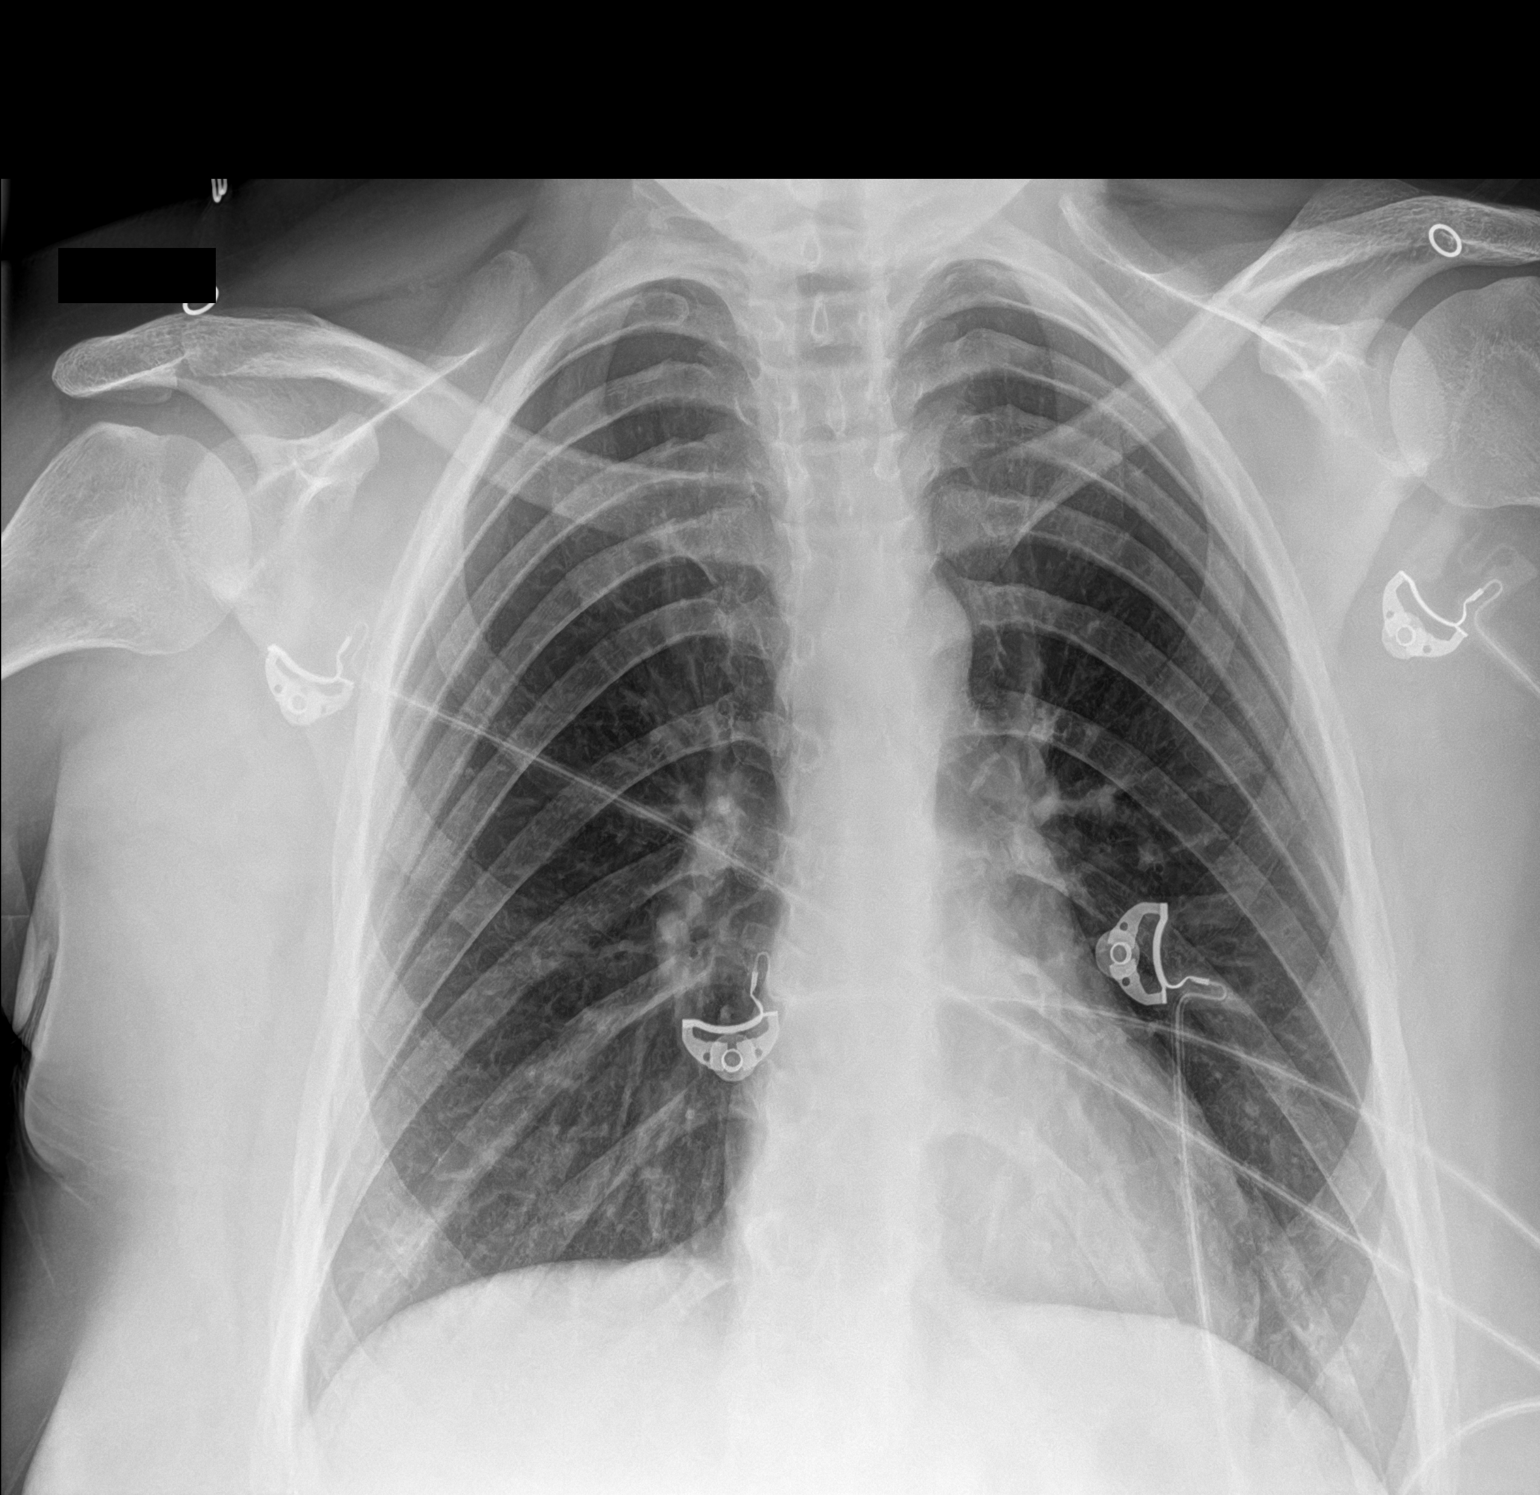
[im 2/2]
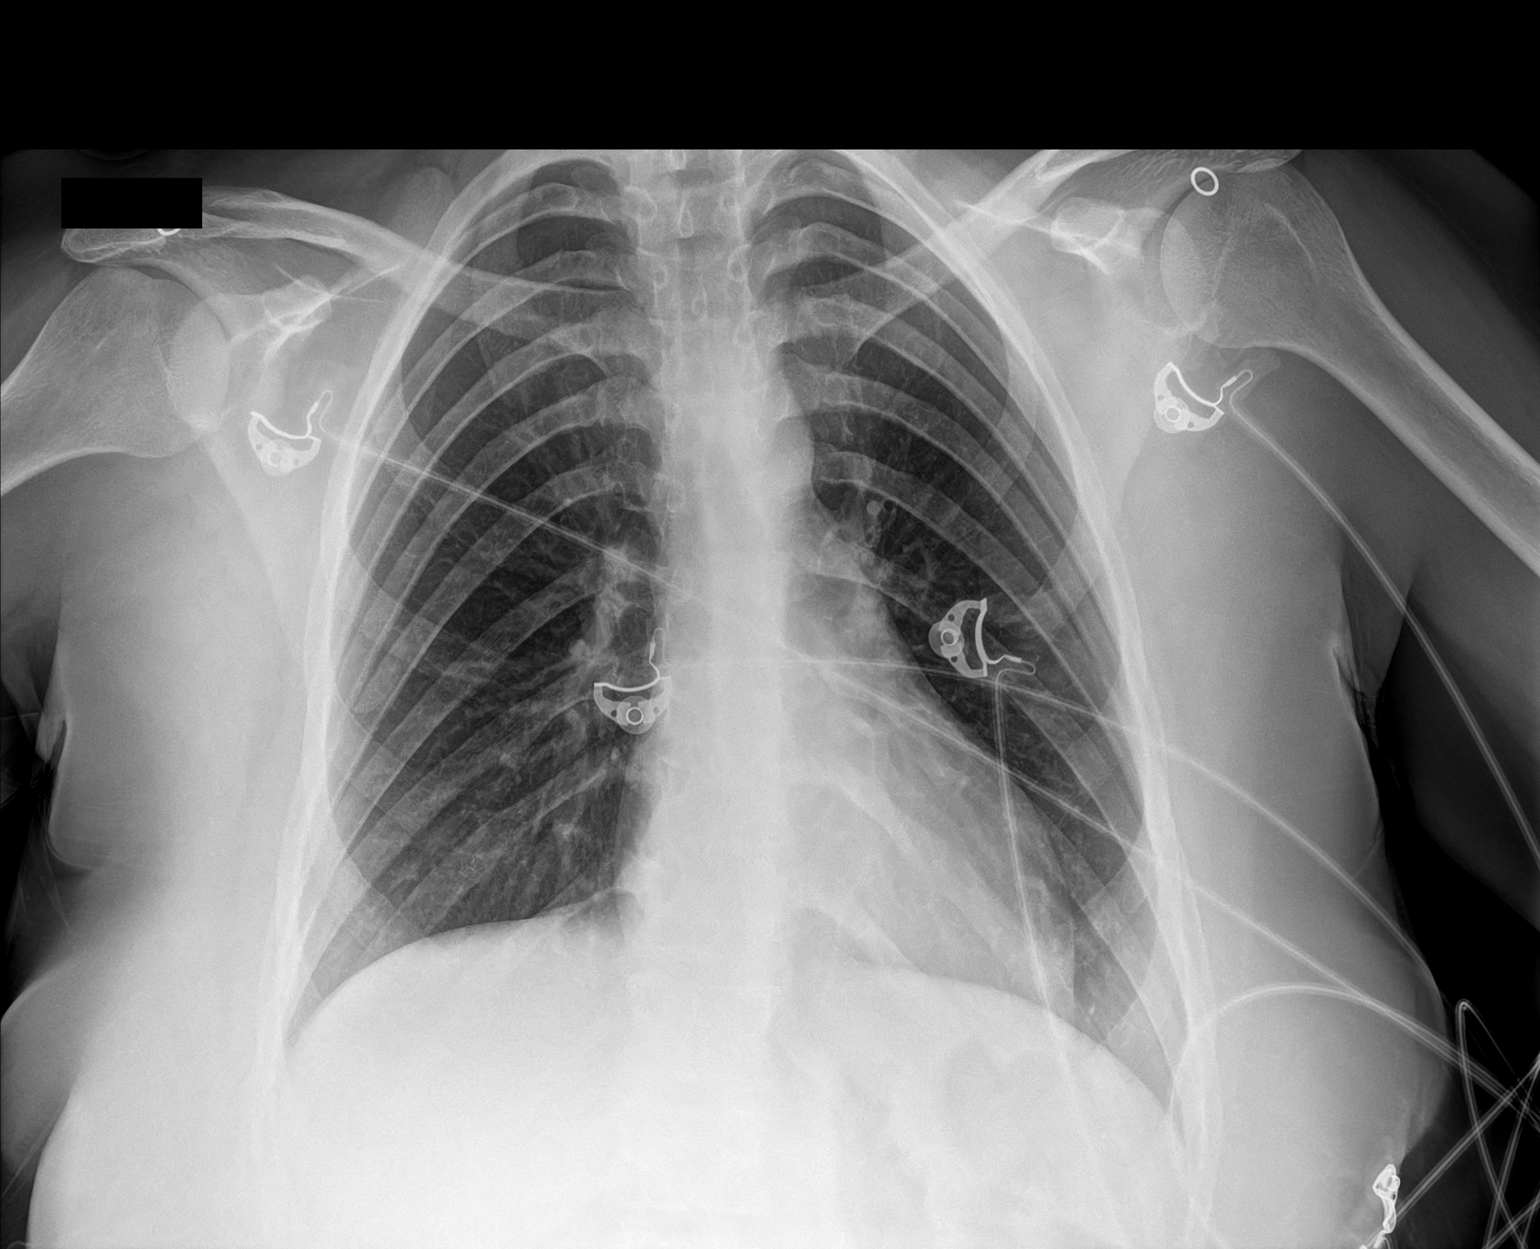

[2 of 2 positions shown; findings below may reference images not displayed]

FINDINGS: No consolidation, features of edema, pneumothorax, or effusion.
Pulmonary vascularity is normally distributed. The cardiomediastinal
contours are unremarkable. No acute osseous or soft tissue
abnormality. Telemetry leads overlie the chest.
IMPRESSION: No acute cardiopulmonary abnormality.

## 2023-05-10 ENCOUNTER — Encounter: Payer: Self-pay | Admitting: Emergency Medicine

## 2023-05-10 ENCOUNTER — Ambulatory Visit
Admission: EM | Admit: 2023-05-10 | Discharge: 2023-05-10 | Disposition: A | Discharge status: Home / Self Care | Payer: Medicaid Other | Attending: Family Medicine | Admitting: Family Medicine

## 2023-05-10 DIAGNOSIS — H04302 Unspecified dacryocystitis of left lacrimal passage: Secondary | ICD-10-CM

## 2023-05-10 MED ORDER — AMOXICILLIN-POT CLAVULANATE 875-125 MG PO TABS: ORAL_TABLET | ORAL | 0 refills | Status: AC

## 2023-05-10 NOTE — ED Triage Notes (Signed)
Redness, pain, swelling of the soft tissue under left eye over the last couple days. Reports it has caused a mild blurred vision in left eye. States she feels like it's progressing. Denies use of contacts or any medications to ease symptoms.
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
## 2023-05-10 NOTE — Discharge Instructions (Addendum)
Apply a clean, warm compress to the inside corner of your eye. To do this: Wash your hands first. Hold the compress over the inside corner of your eye for a few minutes. Repeat this every few hours during the day. Read the attached information sheet.  If symptoms become significantly worse during the night or over the weekend, proceed to the local emergency room.
